# Patient Record
Sex: Male | Born: 1995 | Race: Black or African American | Hispanic: No | Marital: Married | Smoking: Current every day smoker
Health system: Southern US, Community
[De-identification: ages and names within clinical notes are randomized; demographics above are authoritative.]

## PROBLEM LIST (undated history)

## (undated) DIAGNOSIS — N4400 Torsion of testis, unspecified: Secondary | ICD-10-CM

## (undated) HISTORY — PX: TESTICLE SURGERY: SHX794

---

## 2018-12-17 ENCOUNTER — Emergency Department (HOSPITAL_COMMUNITY): Payer: Worker's Compensation

## 2018-12-17 ENCOUNTER — Encounter (HOSPITAL_COMMUNITY): Payer: Self-pay | Admitting: Obstetrics and Gynecology

## 2018-12-17 ENCOUNTER — Other Ambulatory Visit: Payer: Self-pay

## 2018-12-17 ENCOUNTER — Emergency Department (HOSPITAL_COMMUNITY)
Admission: EM | Admit: 2018-12-17 | Discharge: 2018-12-18 | Disposition: A | Discharge status: Home / Self Care | Payer: Worker's Compensation | Attending: Emergency Medicine | Admitting: Emergency Medicine

## 2018-12-17 DIAGNOSIS — Y999 Unspecified external cause status: Secondary | ICD-10-CM | POA: Insufficient documentation

## 2018-12-17 DIAGNOSIS — W010XXA Fall on same level from slipping, tripping and stumbling without subsequent striking against object, initial encounter: Secondary | ICD-10-CM | POA: Diagnosis not present

## 2018-12-17 DIAGNOSIS — S82891A Other fracture of right lower leg, initial encounter for closed fracture: Secondary | ICD-10-CM

## 2018-12-17 DIAGNOSIS — Y939 Activity, unspecified: Secondary | ICD-10-CM | POA: Insufficient documentation

## 2018-12-17 DIAGNOSIS — S82841A Displaced bimalleolar fracture of right lower leg, initial encounter for closed fracture: Secondary | ICD-10-CM | POA: Insufficient documentation

## 2018-12-17 DIAGNOSIS — F17210 Nicotine dependence, cigarettes, uncomplicated: Secondary | ICD-10-CM | POA: Diagnosis not present

## 2018-12-17 DIAGNOSIS — Y929 Unspecified place or not applicable: Secondary | ICD-10-CM | POA: Insufficient documentation

## 2018-12-17 DIAGNOSIS — S99911A Unspecified injury of right ankle, initial encounter: Secondary | ICD-10-CM | POA: Diagnosis present

## 2018-12-17 HISTORY — DX: Torsion of testis, unspecified: N44.00

## 2018-12-17 MED ORDER — OXYCODONE-ACETAMINOPHEN 5-325 MG PO TABS: 1.0000 | ORAL_TABLET | ORAL | 0 refills | Status: DC | PRN

## 2018-12-17 MED ORDER — PROPOFOL 10 MG/ML IV BOLUS
200.0000 mg | Freq: Once | INTRAVENOUS | Status: AC
  Administered 2018-12-17: 40 mg via INTRAVENOUS
  Filled 2018-12-17: qty 20

## 2018-12-17 MED ORDER — HYDROMORPHONE HCL 1 MG/ML IJ SOLN
1.0000 mg | Freq: Once | INTRAMUSCULAR | Status: AC
  Administered 2018-12-17: 21:00:00 1 mg via INTRAMUSCULAR
  Filled 2018-12-17: qty 1

## 2018-12-17 MED ORDER — IBUPROFEN 200 MG PO TABS
600.0000 mg | ORAL_TABLET | Freq: Once | ORAL | Status: AC
  Administered 2018-12-17: 600 mg via ORAL
  Filled 2018-12-17: qty 3

## 2018-12-17 NOTE — ED Provider Notes (Signed)
Patient signed out to me at shift change by Dr. Wilson Singer.    Post-reduction films pending.  Dr. Lyla Glassing to be notified after films are completed.  10:59 PM Repeat films reviewed by Dr. Lyla Glassing, who recommends CT in the ED.  After discussing the case with Dr. Lyla Glassing, the patient was no longer visible on the tract board.  I discovered that he had been discharged by the RN.  Fortunately, I caught the patient in the waiting room.  He was readmitted to the emergency department, completed the necessary CT, and was discharged.   Montine Circle, PA-C 12/18/18 0013    Virgel Manifold, MD 12/18/18 1204

## 2018-12-17 NOTE — ED Provider Notes (Signed)
[facility]-EMERGENCY DEPT Provider Note   CSN: [id number] Arrival date & time: 12/17/18  1802     History   Chief Complaint Chief Complaint  Patient presents with  . Ankle Pain    HPI Mathew Acevedo Surya Schroeter is a 23 y.o. male.     HPI   23yM with R ankle pain. Slipped on wet surface. Severe and persistent pain/swelling since then. Cannot bear weight. Denies any other injury. Happened earlier this afternoon. No intervention prior to arrival.   Past Medical History:  Diagnosis Date  . Testicular torsion     There are no active problems to display for this patient.  Past Surgical History:  Procedure Laterality Date  . TESTICLE SURGERY     Torsion     Home Medications    Prior to Admission medications   Not on File   Family History No family history on file.  Social History Social History   Tobacco Use  . Smoking status: Current Every Day Smoker    Types: Cigarettes  . Smokeless tobacco: Never Used  Substance Use Topics  . Alcohol use: Yes    Comment: Socially  . Drug use: Not Currently    Allergies   Patient has no allergy information on record.   Review of Systems Review of Systems  All systems reviewed and negative, other than as noted in HPI.  Physical Exam Updated Vital Signs BP 130/82 (BP Location: Right Arm)   Pulse (!) 116   Temp 98.4 F (36.9 C) (Oral)   Resp 16   SpO2 96%   Physical Exam Vitals signs and nursing note reviewed.  Constitutional:      General: He is not in acute distress.    Appearance: He is well-developed.  HENT:     Head: Normocephalic and atraumatic.  Eyes:     General:        Right eye: No discharge.        Left eye: No discharge.     Conjunctiva/sclera: Conjunctivae normal.  Neck:     Musculoskeletal: Neck supple.  Cardiovascular:     Rate and Rhythm: Normal rate and regular rhythm.     Heart sounds: Normal heart sounds. No murmur. No friction rub. No gallop.    Pulmonary:     Effort: Pulmonary effort is normal. No respiratory distress.     Breath sounds: Normal breath sounds.  Abdominal:     General: There is no distension.     Palpations: Abdomen is soft.     Tenderness: There is no abdominal tenderness.  Musculoskeletal:        General: Swelling, tenderness, deformity and signs of injury present.     Comments: Marked swelling, deformity R ankle. TTP. Able to wiggle toes. Good DP pulse. Sensation intact to light touch. No proximal tib/fib tenderness.   Skin:    General: Skin is warm and dry.  Neurological:     Mental Status: He is alert.  Psychiatric:        Behavior: Behavior normal.        Thought Content: Thought content normal.      ED Treatments / Results  Labs (all labs ordered are listed, but only abnormal results are displayed) Labs Reviewed - No data to display  EKG None  Radiology Dg Ankle Complete Right  Result Date: 12/17/2018 CLINICAL DATA:  Post reduction EXAM: RIGHT ANKLE - COMPLETE 3+ VIEW COMPARISON:  Radiograph 12/17/2018 FINDINGS: Patient is post reduction and splinting of  the ankle. There is mild residual posterior displacement of the distal fibular diaphyseal fracture (Weber C) as well as a small minimally displaced posterior malleolar fracture fragment. Mild lateral talar shift persists though this is suboptimally evaluated in the absence of a mortise view. IMPRESSION: 1. Grossly improved alignment post reduction and splinting. 2. Mild residual posterior displacement of the distal fibular diaphyseal fracture and small minimally displaced posterior malleolar fracture. 3. Mild lateral talar shift persists though this is suboptimally evaluated in the absence of a mortise view. Electronically Signed   By: Kreg ShropshirePrice  DeHay M.D.   On: 12/17/2018 22:40   Dg Ankle Complete Right  Result Date: 12/17/2018 CLINICAL DATA:  Pain EXAM: RIGHT ANKLE - COMPLETE 3+ VIEW COMPARISON:  None. FINDINGS: There is an acute displaced  fracture of the distal fibula. There is medial displacement of the tibia with respect to the talus there is extensive surrounding soft tissue swelling. There is a slight irregularity of the posterior malleolus without a clear fracture plane. A large joint effusion is noted. IMPRESSION: 1. Acute displaced fracture of the distal fibula with disruption of the mortise joint. 2. Irregular appearance of the posterior malleolus without a clear fracture plane. 3. Extensive surrounding soft tissue swelling. Electronically Signed   By: Katherine Mantlehristopher  Green M.D.   On: 12/17/2018 19:13   Ct Ankle Right Wo Contrast  Result Date: 12/18/2018 CLINICAL DATA:  Fracture EXAM: CT OF THE RIGHT ANKLE WITHOUT CONTRAST TECHNIQUE: Multidetector CT imaging of the right ankle was performed according to the standard protocol. Multiplanar CT image reconstructions were also generated. COMPARISON:  12/17/2018 FINDINGS: Bones/Joint/Cartilage Again noted is a displaced fracture of the distal fibular diaphysis. There is widening of the medial clear space. There is an acute fracture of the posterior malleolus. Ligaments Suboptimally assessed by CT. Muscles and Tendons Not well evaluated on this exam. Soft tissues There is extensive surrounding soft tissue swelling. IMPRESSION: 1. Displaced fracture of the distal fibular diaphysis. 2. Acute fracture of the posterior malleolus. 3. Persistent lateral talar shift. 4. Extensive surrounding soft tissue swelling. Electronically Signed   By: Katherine Mantlehristopher  Green M.D.   On: 12/18/2018 00:19    Procedures .Sedation  Date/Time: 12/17/2018 9:50 PM Performed by: Raeford RazorKohut, Caron Ode, MD Authorized by: Raeford RazorKohut, Keyetta Hollingworth, MD   Consent:    Consent obtained:  Verbal and written   Consent given by:  Patient   Risks discussed:  Allergic reaction, dysrhythmia, inadequate sedation, nausea, prolonged hypoxia resulting in organ damage, prolonged sedation necessitating reversal, respiratory compromise necessitating  ventilatory assistance and intubation and vomiting   Alternatives discussed:  Analgesia without sedation, anxiolysis and regional anesthesia Universal protocol:    Procedure explained and questions answered to patient or proxy's satisfaction: yes     Relevant documents present and verified: yes     Test results available and properly labeled: yes     Imaging studies available: yes     Required blood products, implants, devices, and special equipment available: yes     Site/side marked: yes     Immediately prior to procedure a time out was called: yes     Patient identity confirmation method:  Verbally with patient Indications:    Procedure necessitating sedation performed by:  Physician performing sedation Pre-sedation assessment:    Time since last food or drink:  .   ASA classification: class 1 - normal, healthy patient     Neck mobility: normal     Mouth opening:  3 or more finger widths   Thyromental distance:  4 finger widths   Mallampati score:  II - soft palate, uvula, fauces visible   Pre-sedation assessments completed and reviewed: airway patency, cardiovascular function, hydration status, mental status, nausea/vomiting, pain level, respiratory function and temperature   Immediate pre-procedure details:    Reassessment: Patient reassessed immediately prior to procedure     Reviewed: vital signs, relevant labs/tests and NPO status     Verified: bag valve mask available, emergency equipment available, intubation equipment available, IV patency confirmed, oxygen available and suction available   Procedure details (see MAR for exact dosages):    Preoxygenation:  Nasal cannula   Sedation:  Propofol   Intended level of sedation: deep   Intra-procedure monitoring:  Blood pressure monitoring, cardiac monitor, continuous pulse oximetry, frequent LOC assessments, frequent vital sign checks and continuous capnometry   Intra-procedure events: none     Total Provider sedation time  (minutes):  30 Post-procedure details:    Attendance: Constant attendance by certified staff until patient recovered     Recovery: Patient returned to pre-procedure baseline     Post-sedation assessments completed and reviewed: airway patency, cardiovascular function, hydration status, mental status, nausea/vomiting, pain level, respiratory function and temperature     Patient is stable for discharge or admission: yes     Patient tolerance:  Tolerated well, no immediate complications Reduction of dislocation, R ankle  Date/Time: 12/17/2018 9:50 PM Performed by: Raeford Razor, MD Authorized by: Raeford Razor, MD  Consent: Verbal consent obtained. Written consent obtained. Risks and benefits: risks, benefits and alternatives were discussed Consent given by: patient Patient understanding: patient states understanding of the procedure being performed Patient consent: the patient's understanding of the procedure matches consent given Procedure consent: procedure consent matches procedure scheduled Relevant documents: relevant documents present and verified Test results: test results available and properly labeled Site marked: the operative site was marked Imaging studies: imaging studies available Patient identity confirmed: verbally with patient and arm band Time out: Immediately prior to procedure a "time out" was called to verify the correct patient, procedure, equipment, support staff and site/side marked as required. Local anesthesia used: no  Anesthesia: Local anesthesia used: no  Sedation: Patient sedated: yes Sedation type: moderate (conscious) sedation Sedatives: propofol Analgesia: hydromorphone Vitals: Vital signs were monitored during sedation.  Patient tolerance: patient tolerated the procedure well with no immediate complications     (including critical care time)  Medications Ordered in ED Medications  HYDROmorphone (DILAUDID) injection 1 mg (1 mg Intramuscular  Given 12/17/18 2040)  ibuprofen (ADVIL) tablet 600 mg (600 mg Oral Given 12/17/18 2040)  propofol (DIPRIVAN) 10 mg/mL bolus/IV push 200 mg (40 mg Intravenous Given 12/17/18 2157)  oxyCODONE-acetaminophen (PERCOCET/ROXICET) 5-325 MG per tablet 2 tablet (2 tablets Oral Given 12/18/18 0019)     Initial Impression / Assessment and Plan / ED Course  I have reviewed the triage vital signs and the nursing notes.  Pertinent labs & imaging results that were available during my care of the patient were reviewed by me and considered in my medical decision making (see chart for details).       23 year old male with closed fracture dislocation of his right ankle.  Neurovascular intact.  Reduced and splinted.  Remains neurovascular intact post procedure.  Care signed out to Gillis Ends, [place].  Repeat discussion with orthopedics after reduction films done.  Anticipate outpatient follow-up with orthopedics.  Crutches.  PRN pain medication.   Final Clinical Impressions(s) / ED Diagnoses   Final diagnoses:  Closed fracture of right ankle,  initial encounter    ED Discharge Orders    None       Virgel Manifold, MD 12/18/18 1214

## 2018-12-17 NOTE — ED Triage Notes (Signed)
Patient reports the ED with complaint of ankle pain to the right ankle. Patient reports he slipped and fell in the rain. Swelling noted to area.

## 2018-12-18 ENCOUNTER — Emergency Department (HOSPITAL_COMMUNITY): Payer: Worker's Compensation

## 2018-12-18 MED ORDER — OXYCODONE-ACETAMINOPHEN 5-325 MG PO TABS
2.0000 | ORAL_TABLET | Freq: Once | ORAL | Status: AC
  Administered 2018-12-18: 2 via ORAL
  Filled 2018-12-18: qty 2

## 2018-12-21 ENCOUNTER — Encounter (HOSPITAL_BASED_OUTPATIENT_CLINIC_OR_DEPARTMENT_OTHER): Payer: Self-pay | Admitting: *Deleted

## 2018-12-21 ENCOUNTER — Other Ambulatory Visit (HOSPITAL_COMMUNITY): Payer: Self-pay | Admitting: Orthopedic Surgery

## 2018-12-21 ENCOUNTER — Other Ambulatory Visit: Payer: Self-pay

## 2018-12-22 ENCOUNTER — Ambulatory Visit (HOSPITAL_BASED_OUTPATIENT_CLINIC_OR_DEPARTMENT_OTHER)
Admission: RE | Admit: 2018-12-22 | Discharge: 2018-12-22 | Disposition: A | Discharge status: Home / Self Care | Source: Ambulatory Visit | Attending: Orthopedic Surgery | Admitting: Orthopedic Surgery

## 2018-12-22 ENCOUNTER — Ambulatory Visit (HOSPITAL_BASED_OUTPATIENT_CLINIC_OR_DEPARTMENT_OTHER): Admitting: Anesthesiology

## 2018-12-22 ENCOUNTER — Other Ambulatory Visit (HOSPITAL_COMMUNITY)
Admission: RE | Admit: 2018-12-22 | Discharge: 2018-12-22 | Disposition: A | Discharge status: Home / Self Care | Source: Ambulatory Visit | Attending: Orthopedic Surgery | Admitting: Orthopedic Surgery

## 2018-12-22 ENCOUNTER — Encounter (HOSPITAL_BASED_OUTPATIENT_CLINIC_OR_DEPARTMENT_OTHER)
Admission: RE | Disposition: A | Discharge status: Home / Self Care | Payer: Self-pay | Source: Ambulatory Visit | Attending: Orthopedic Surgery

## 2018-12-22 ENCOUNTER — Encounter (HOSPITAL_BASED_OUTPATIENT_CLINIC_OR_DEPARTMENT_OTHER): Payer: Self-pay | Admitting: Certified Registered"

## 2018-12-22 DIAGNOSIS — F17210 Nicotine dependence, cigarettes, uncomplicated: Secondary | ICD-10-CM | POA: Diagnosis not present

## 2018-12-22 DIAGNOSIS — W010XXA Fall on same level from slipping, tripping and stumbling without subsequent striking against object, initial encounter: Secondary | ICD-10-CM | POA: Insufficient documentation

## 2018-12-22 DIAGNOSIS — S93421A Sprain of deltoid ligament of right ankle, initial encounter: Secondary | ICD-10-CM | POA: Diagnosis not present

## 2018-12-22 DIAGNOSIS — Z20828 Contact with and (suspected) exposure to other viral communicable diseases: Secondary | ICD-10-CM | POA: Insufficient documentation

## 2018-12-22 DIAGNOSIS — S93431A Sprain of tibiofibular ligament of right ankle, initial encounter: Secondary | ICD-10-CM | POA: Insufficient documentation

## 2018-12-22 DIAGNOSIS — S82851A Displaced trimalleolar fracture of right lower leg, initial encounter for closed fracture: Secondary | ICD-10-CM | POA: Diagnosis not present

## 2018-12-22 HISTORY — PX: ORIF ANKLE FRACTURE: SHX5408

## 2018-12-22 LAB — SARS CORONAVIRUS 2 BY RT PCR (HOSPITAL ORDER, PERFORMED IN ~~LOC~~ HOSPITAL LAB): SARS Coronavirus 2: NEGATIVE

## 2018-12-22 SURGERY — OPEN REDUCTION INTERNAL FIXATION (ORIF) ANKLE FRACTURE
Anesthesia: General | Site: Ankle | Laterality: Right

## 2018-12-22 MED ORDER — DEXAMETHASONE SODIUM PHOSPHATE 10 MG/ML IJ SOLN
INTRAMUSCULAR | Status: DC | PRN
  Administered 2018-12-22: 5 mg via INTRAVENOUS

## 2018-12-22 MED ORDER — PROPOFOL 500 MG/50ML IV EMUL
INTRAVENOUS | Status: DC | PRN
  Administered 2018-12-22: 25 ug/kg/min via INTRAVENOUS

## 2018-12-22 MED ORDER — BUPIVACAINE LIPOSOME 1.3 % IJ SUSP
INTRAMUSCULAR | Status: DC | PRN
  Administered 2018-12-22: 10 mL via PERINEURAL

## 2018-12-22 MED ORDER — FENTANYL CITRATE (PF) 100 MCG/2ML IJ SOLN
INTRAMUSCULAR | Status: AC
  Filled 2018-12-22: qty 2

## 2018-12-22 MED ORDER — FENTANYL CITRATE (PF) 100 MCG/2ML IJ SOLN
50.0000 ug | INTRAMUSCULAR | Status: DC | PRN
  Administered 2018-12-22: 100 ug via INTRAVENOUS

## 2018-12-22 MED ORDER — KETOROLAC TROMETHAMINE 30 MG/ML IJ SOLN
INTRAMUSCULAR | Status: DC | PRN
  Administered 2018-12-22: 30 mg via INTRAVENOUS

## 2018-12-22 MED ORDER — PROMETHAZINE HCL 25 MG/ML IJ SOLN: 6.2500 mg | INTRAMUSCULAR | Status: DC | PRN

## 2018-12-22 MED ORDER — BUPIVACAINE HCL (PF) 0.25 % IJ SOLN
INTRAMUSCULAR | Status: DC | PRN
  Administered 2018-12-22: 15 mL

## 2018-12-22 MED ORDER — CEFAZOLIN SODIUM-DEXTROSE 2-4 GM/100ML-% IV SOLN
INTRAVENOUS | Status: AC
  Filled 2018-12-22: qty 100

## 2018-12-22 MED ORDER — LACTATED RINGERS IV SOLN
INTRAVENOUS | Status: DC
  Administered 2018-12-22: 12:00:00 via INTRAVENOUS

## 2018-12-22 MED ORDER — OXYCODONE HCL 5 MG PO TABS: 5.0000 mg | ORAL_TABLET | Freq: Four times a day (QID) | ORAL | 0 refills | Status: AC | PRN

## 2018-12-22 MED ORDER — ENOXAPARIN SODIUM 40 MG/0.4ML ~~LOC~~ SOLN: 40.0000 mg | SUBCUTANEOUS | 0 refills | Status: AC

## 2018-12-22 MED ORDER — FENTANYL CITRATE (PF) 100 MCG/2ML IJ SOLN
25.0000 ug | INTRAMUSCULAR | Status: DC | PRN
  Administered 2018-12-22: 25 ug via INTRAVENOUS

## 2018-12-22 MED ORDER — LIDOCAINE HCL (CARDIAC) PF 100 MG/5ML IV SOSY
PREFILLED_SYRINGE | INTRAVENOUS | Status: DC | PRN
  Administered 2018-12-22: 50 mg via INTRAVENOUS

## 2018-12-22 MED ORDER — MIDAZOLAM HCL 2 MG/2ML IJ SOLN
1.0000 mg | INTRAMUSCULAR | Status: DC | PRN
  Administered 2018-12-22: 12:00:00 2 mg via INTRAVENOUS

## 2018-12-22 MED ORDER — SODIUM CHLORIDE 0.9 % IV SOLN: INTRAVENOUS | Status: DC

## 2018-12-22 MED ORDER — CHLORHEXIDINE GLUCONATE 4 % EX LIQD: 60.0000 mL | Freq: Once | CUTANEOUS | Status: DC

## 2018-12-22 MED ORDER — MIDAZOLAM HCL 2 MG/2ML IJ SOLN
INTRAMUSCULAR | Status: AC
  Filled 2018-12-22: qty 2

## 2018-12-22 MED ORDER — CEFAZOLIN SODIUM-DEXTROSE 2-4 GM/100ML-% IV SOLN
2.0000 g | INTRAVENOUS | Status: AC
  Administered 2018-12-22: 2 g via INTRAVENOUS

## 2018-12-22 MED ORDER — PROPOFOL 10 MG/ML IV BOLUS
INTRAVENOUS | Status: DC | PRN
  Administered 2018-12-22: 300 mg via INTRAVENOUS

## 2018-12-22 MED ORDER — BUPIVACAINE-EPINEPHRINE 0.25% -1:200000 IJ SOLN
INTRAMUSCULAR | Status: DC | PRN
  Administered 2018-12-22: 10 mL

## 2018-12-22 SURGICAL SUPPLY — 75 items
ANCHOR SUT 1.45 SZ 1 SHORT (Anchor) ×3 IMPLANT
BANDAGE ESMARK 6X9 LF (GAUZE/BANDAGES/DRESSINGS) ×1 IMPLANT
BIT DRILL 2.5X2.75 QC CALB (BIT) ×3 IMPLANT
BIT DRILL 3.5X5.5 QC CALB (BIT) ×3 IMPLANT
BLADE SURG 15 STRL LF DISP TIS (BLADE) ×2 IMPLANT
BLADE SURG 15 STRL SS (BLADE) ×4
BNDG COHESIVE 4X5 TAN STRL (GAUZE/BANDAGES/DRESSINGS) ×3 IMPLANT
BNDG COHESIVE 6X5 TAN STRL LF (GAUZE/BANDAGES/DRESSINGS) ×3 IMPLANT
BNDG ESMARK 4X9 LF (GAUZE/BANDAGES/DRESSINGS) IMPLANT
BNDG ESMARK 6X9 LF (GAUZE/BANDAGES/DRESSINGS) ×3
CANISTER SUCT 1200ML W/VALVE (MISCELLANEOUS) ×3 IMPLANT
CHLORAPREP W/TINT 26 (MISCELLANEOUS) ×3 IMPLANT
COVER BACK TABLE REUSABLE LG (DRAPES) ×3 IMPLANT
COVER WAND RF STERILE (DRAPES) IMPLANT
CUFF TOURN SGL QUICK 34 (TOURNIQUET CUFF) ×2
CUFF TRNQT CYL 34X4.125X (TOURNIQUET CUFF) ×1 IMPLANT
DECANTER SPIKE VIAL GLASS SM (MISCELLANEOUS) IMPLANT
DRAPE EXTREMITY T 121X128X90 (DISPOSABLE) ×3 IMPLANT
DRAPE HALF SHEET 70X43 (DRAPES) ×3 IMPLANT
DRAPE OEC MINIVIEW 54X84 (DRAPES) ×3 IMPLANT
DRAPE U-SHAPE 47X51 STRL (DRAPES) ×3 IMPLANT
DRSG MEPITEL 4X7.2 (GAUZE/BANDAGES/DRESSINGS) ×3 IMPLANT
DRSG PAD ABDOMINAL 8X10 ST (GAUZE/BANDAGES/DRESSINGS) ×6 IMPLANT
ELECT REM PT RETURN 9FT ADLT (ELECTROSURGICAL) ×3
ELECTRODE REM PT RTRN 9FT ADLT (ELECTROSURGICAL) ×1 IMPLANT
FIXATION ZIPTIGHT ANKLE SNDSMS (Ankle) ×1 IMPLANT
GAUZE SPONGE 4X4 12PLY STRL (GAUZE/BANDAGES/DRESSINGS) ×3 IMPLANT
GLOVE BIO SURGEON STRL SZ7 (GLOVE) ×3 IMPLANT
GLOVE BIO SURGEON STRL SZ8 (GLOVE) ×3 IMPLANT
GLOVE BIOGEL PI IND STRL 7.0 (GLOVE) ×1 IMPLANT
GLOVE BIOGEL PI IND STRL 8 (GLOVE) ×3 IMPLANT
GLOVE BIOGEL PI INDICATOR 7.0 (GLOVE) ×2
GLOVE BIOGEL PI INDICATOR 8 (GLOVE) ×6
GLOVE ECLIPSE 8.0 STRL XLNG CF (GLOVE) ×3 IMPLANT
GOWN STRL REUS W/ TWL LRG LVL3 (GOWN DISPOSABLE) ×1 IMPLANT
GOWN STRL REUS W/ TWL XL LVL3 (GOWN DISPOSABLE) ×2 IMPLANT
GOWN STRL REUS W/TWL LRG LVL3 (GOWN DISPOSABLE) ×2
GOWN STRL REUS W/TWL XL LVL3 (GOWN DISPOSABLE) ×4
NEEDLE HYPO 22GX1.5 SAFETY (NEEDLE) IMPLANT
NS IRRIG 1000ML POUR BTL (IV SOLUTION) ×3 IMPLANT
PACK BASIN DAY SURGERY FS (CUSTOM PROCEDURE TRAY) ×3 IMPLANT
PAD CAST 4YDX4 CTTN HI CHSV (CAST SUPPLIES) ×1 IMPLANT
PADDING CAST ABS 4INX4YD NS (CAST SUPPLIES)
PADDING CAST ABS COTTON 4X4 ST (CAST SUPPLIES) IMPLANT
PADDING CAST COTTON 4X4 STRL (CAST SUPPLIES) ×2
PADDING CAST COTTON 6X4 STRL (CAST SUPPLIES) ×3 IMPLANT
PENCIL BUTTON HOLSTER BLD 10FT (ELECTRODE) ×3 IMPLANT
PLATE ACE 100DEG 8HOLE (Plate) ×3 IMPLANT
SANITIZER HAND PURELL 535ML FO (MISCELLANEOUS) ×3 IMPLANT
SCREW CORTICAL 3.5MM  16MM (Screw) ×2 IMPLANT
SCREW CORTICAL 3.5MM 14MM (Screw) ×6 IMPLANT
SCREW CORTICAL 3.5MM 16MM (Screw) ×1 IMPLANT
SCREW CORTICAL 3.5MM 18MM (Screw) ×12 IMPLANT
SLEEVE SCD COMPRESS KNEE MED (MISCELLANEOUS) ×3 IMPLANT
SPLINT FAST PLASTER 5X30 (CAST SUPPLIES) ×40
SPLINT PLASTER CAST FAST 5X30 (CAST SUPPLIES) ×20 IMPLANT
SPONGE LAP 18X18 RF (DISPOSABLE) ×3 IMPLANT
STOCKINETTE 6  STRL (DRAPES) ×2
STOCKINETTE 6 STRL (DRAPES) ×1 IMPLANT
SUCTION FRAZIER HANDLE 10FR (MISCELLANEOUS) ×2
SUCTION TUBE FRAZIER 10FR DISP (MISCELLANEOUS) ×1 IMPLANT
SUT ETHILON 3 0 PS 1 (SUTURE) ×6 IMPLANT
SUT FIBERWIRE #2 38 T-5 BLUE (SUTURE)
SUT MNCRL AB 3-0 PS2 18 (SUTURE) IMPLANT
SUT VIC AB 0 SH 27 (SUTURE) ×3 IMPLANT
SUT VIC AB 2-0 SH 27 (SUTURE) ×6
SUT VIC AB 2-0 SH 27XBRD (SUTURE) ×3 IMPLANT
SUTURE FIBERWR #2 38 T-5 BLUE (SUTURE) IMPLANT
SYR BULB 3OZ (MISCELLANEOUS) ×3 IMPLANT
SYR CONTROL 10ML LL (SYRINGE) IMPLANT
TOWEL GREEN STERILE FF (TOWEL DISPOSABLE) ×6 IMPLANT
TUBE CONNECTING 20'X1/4 (TUBING) ×1
TUBE CONNECTING 20X1/4 (TUBING) ×2 IMPLANT
UNDERPAD 30X36 HEAVY ABSORB (UNDERPADS AND DIAPERS) ×3 IMPLANT
ZIPTIGHT ANKLE SYNODESMOSS FIX (Ankle) ×3 IMPLANT

## 2018-12-22 NOTE — Op Note (Signed)
12/22/2018  2:23 PM  PATIENT:  Mathew Acevedo  23 y.o. male  PRE-OPERATIVE DIAGNOSIS: 1.  Right ankle trimalleolar fracture      2.  Right ankle syndesmosis disruption      3.  Right ankle deltoid ligament rupture  POST-OPERATIVE DIAGNOSIS:  Same  Procedure(s): 1.  Open treatment of right ankle trimalleolar fracture with internal fixation without fixation of the posterior lip 2.  Open treatment of right ankle syndesmosis disruption with internal fixation 3.  Stress examination of the right ankle under fluoroscopy 4.  Open repair of right ankle deltoid ligament rupture 5.  AP, mortise and lateral radiographs of the right ankle  SURGEON:  Wylene Simmer, MD  ASSISTANT: None  ANESTHESIA:   General, regional  EBL:  minimal   TOURNIQUET:   Total Tourniquet Time Documented: Thigh (Right) - 67 minutes Total: Thigh (Right) - 67 minutes  COMPLICATIONS:  None apparent  DISPOSITION:  Extubated, awake and stable to recovery.  INDICATION FOR PROCEDURE: The patient is a 23 year old male who injured his right ankle approximately 5 days ago.  He sustained a trimalleolar fracture with disruption of the syndesmosis and deltoid ligament.  He presents now for operative treatment of this displaced and unstable right ankle injury.  The risks and benefits of the alternative treatment options have been discussed in detail.  The patient wishes to proceed with surgery and specifically understands risks of bleeding, infection, nerve damage, blood clots, need for additional surgery, amputation and death.  PROCEDURE IN DETAIL:  After pre operative consent was obtained, and the correct operative site was identified, the patient was brought to the operating room and placed supine on the OR table.  Anesthesia was administered.  Pre-operative antibiotics were administered.  A surgical timeout was taken.  The right lower extremity was prepped and draped in standard sterile fashion with a tourniquet  around the thigh.  The extremity was exsanguinated and the tourniquet was inflated to 250 mmHg.  A longitudinal incision was made over the lateral malleolus.  Dissection was carried down through the subcutaneous tissues.  The fracture site was identified.  It was cleaned of all hematoma and irrigated.  It was reduced and held with a tenaculum.  A 3.5 mm lag screw was inserted from posterior to anterior across the fracture site and compressed the fracture appropriately.  An 8 hole one third tubular plate from the [employer] small frag set was then contoured to fit the lateral malleolus.  It was secured proximally with 3 bicortical screws and distally with a combination of bicortical and unicortical screws.  Radiographs confirmed appropriate reduction of the fibula.  A stress examination was then performed.  Dorsiflexion and external rotation stress was applied to the supinated forefoot.  There was substantial widening of the medial clear space and syndesmosis.  The decision was made at that point to proceed with syndesmosis fixation.  Dissection was carried distally and anteriorly to expose the anterior aspect of the incision area.  The syndesmosis was reduced and held while a drill hole was made through the distal fibula and tibia.  This was in the third most distal hole of the plate in line with the physeal scar of the distal tibia.  A [employer] zip tight device was inserted through the hole and toggled at the far cortex of the tibia.  The syndesmosis was compressed and the device tightened appropriately.  Radiographs confirmed appropriate alignment of the syndesmosis and ankle mortise.  The stress examination was repeated  showing stability at the syndesmosis but continued instability at the medial clear space with talar tilt consistent with deltoid ligament rupture.  A longitudinal incision was made over the medial malleolus.  Dissection was carried down to the level of the superficial deltoid ligament.  Both  the deep and superficial deltoid ligaments were completely ruptured with gross instability medially.  The joint was reduced.  The medial malleolus was cleared of all torn tendon and decorticated with a rondure.  A 1.45 mm juggernaut was inserted.  Suture was passed down through the deep deltoid fibers distally.  The deltoid fibers were advanced back onto the medial malleolus and the suture tied.  The suture was then passed through the superficial deltoid proximally and tied and then again through the superficial deltoid distally and tied for a final time.  The remaining tear and superficial deltoid was repaired with figure-of-eight sutures of 0 Vicryl.  Final AP, mortise and lateral radiographs confirmed appropriate position and length of all hardware and appropriate reduction of the ankle fracture.  The medial malleolus avulsion fracture was excised during debridement of the deltoid ligament origin.  The posterior malleolus fracture was noted to be adequately reduced and did not include any significant amount of articular surface.  The wounds were irrigated copiously.  Deep subcutaneous tissues were approximated with 2-0 Vicryl.  Skin incisions were closed with nylon.  Sterile dressings were applied followed by a well-padded short leg splint.  Tourniquet was released after application of the dressings.  The patient was awakened from anesthesia and transported to the recovery room in stable condition.   FOLLOW UP PLAN: Nonweightbearing on the right lower extremity.  Follow-up in 2 weeks for suture removal and conversion to a short leg cast.  Plan 6 weeks postop nonweightbearing immobilization.  Lovenox for DVT prophylaxis due to history of cigarette smoking.  RADIOGRAPHS: AP, mortise and lateral radiographs of the right ankle are obtained intraoperatively.  These show interval reduction and fixation of the trimalleolar ankle fracture and syndesmosis.  Hardware is appropriately positioned and of the  appropriate lengths.  No other acute injuries are noted.

## 2018-12-22 NOTE — Progress Notes (Signed)
Assisted Dr. Singer with right, ultrasound guided, popliteal/saphenous block. Side rails up, monitors on throughout procedure. See vital signs in flow sheet. Tolerated Procedure well. 

## 2018-12-22 NOTE — Discharge Instructions (Addendum)
Mathew Simmer, MD [facility]  Please read the following information regarding your care after surgery.  Medications  You only need a prescription for the narcotic pain medicine (ex. oxycodone, Percocet, Norco).  All of the other medicines listed below are available over the counter. X Aleve 2 pills twice a day for the first 3 days after surgery. X acetominophen (Tylenol) 650 mg every 4-6 hours as you need for minor to moderate pain X oxycodone as prescribed for severe pain  Narcotic pain medicine (ex. oxycodone, Percocet, Vicodin) will cause constipation.  To prevent this problem, take the following medicines while you are taking any pain medicine. X docusate sodium (Colace) 100 mg twice a day X senna (Senokot) 2 tablets twice a day  X To help prevent blood clots, take a Lovenox shot once a day for two weeks starting the day after surgery.  Then switch to a baby aspirin (81 mg) twice a day for a month.  You should also get up every hour while you are awake to move around.    Weight Bearing X Do not bear any weight on the operated leg or foot.  Cast / Splint / Dressing X Keep your splint, cast or dressing clean and dry.  Dont put anything (coat hanger, pencil, etc) down inside of it.  If it gets damp, use a hair dryer on the cool setting to dry it.  If it gets soaked, call the office to schedule an appointment for a cast change.  After your dressing, cast or splint is removed; you may shower, but do not soak or scrub the wound.  Allow the water to run over it, and then gently pat it dry.  Swelling It is normal for you to have swelling where you had surgery.  To reduce swelling and pain, keep your toes above your nose for at least 3 days after surgery.  It may be necessary to keep your foot or leg elevated for several weeks.  If it hurts, it should be elevated.  Follow Up Call my office at [phone] when you are discharged from the hospital or surgery center to schedule an appointment to  be seen two weeks after surgery.  Call my office at [phone] if you develop a fever >101.5 F, nausea, vomiting, bleeding from the surgical site or severe pain.      No ibuprofen before 8:00pm  Post Anesthesia Home Care Instructions  Activity: Get plenty of rest for the remainder of the day. A responsible individual must stay with you for 24 hours following the procedure.  For the next 24 hours, DO NOT: -Drive a car -Paediatric nurse -Drink alcoholic beverages -Take any medication unless instructed by your physician -Make any legal decisions or sign important papers.  Meals: Start with liquid foods such as gelatin or soup. Progress to regular foods as tolerated. Avoid greasy, spicy, heavy foods. If nausea and/or vomiting occur, drink only clear liquids until the nausea and/or vomiting subsides. Call your physician if vomiting continues.  Special Instructions/Symptoms: Your throat may feel dry or sore from the anesthesia or the breathing tube placed in your throat during surgery. If this causes discomfort, gargle with warm salt water. The discomfort should disappear within 24 hours.  If you had a scopolamine patch placed behind your ear for the management of post- operative nausea and/or vomiting:  1. The medication in the patch is effective for 72 hours, after which it should be removed.  Wrap patch in a tissue and discard in the  trash. Wash hands thoroughly with soap and water. 2. You may remove the patch earlier than 72 hours if you experience unpleasant side effects which may include dry mouth, dizziness or visual disturbances. 3. Avoid touching the patch. Wash your hands with soap and water after contact with the patch.     Regional Anesthesia Blocks  1. Numbness or the inability to move the "blocked" extremity may last from 3-48 hours after placement. The length of time depends on the medication injected and your individual response to the medication. If the numbness is  not going away after 48 hours, call your surgeon.  2. The extremity that is blocked will need to be protected until the numbness is gone and the  Strength has returned. Because you cannot feel it, you will need to take extra care to avoid injury. Because it may be weak, you may have difficulty moving it or using it. You may not know what position it is in without looking at it while the block is in effect.  3. For blocks in the legs and feet, returning to weight bearing and walking needs to be done carefully. You will need to wait until the numbness is entirely gone and the strength has returned. You should be able to move your leg and foot normally before you try and bear weight or walk. You will need someone to be with you when you first try to ensure you do not fall and possibly risk injury.  4. Bruising and tenderness at the needle site are common side effects and will resolve in a few days.  5. Persistent numbness or new problems with movement should be communicated to the surgeon or the [facility] [phone] [facility] [phone]).    Information for Discharge Teaching: EXPAREL (bupivacaine liposome injectable suspension)   Your surgeon or anesthesiologist gave you EXPAREL(bupivacaine) to help control your pain after surgery.   EXPAREL is a local anesthetic that provides pain relief by numbing the tissue around the surgical site.  EXPAREL is designed to release pain medication over time and can control pain for up to 72 hours.  Depending on how you respond to EXPAREL, you may require less pain medication during your recovery.  Possible side effects:  Temporary loss of sensation or ability to move in the area where bupivacaine was injected.  Nausea, vomiting, constipation  Rarely, numbness and tingling in your mouth or lips, lightheadedness, or anxiety may occur.  Call your doctor right away if you think you may be experiencing any of these  sensations, or if you have other questions regarding possible side effects.  Follow all other discharge instructions given to you by your surgeon or nurse. Eat a healthy diet and drink plenty of water or other fluids.  If you return to the hospital for any reason within 96 hours following the administration of EXPAREL, it is important for health care providers to know that you have received this anesthetic. A teal colored band has been placed on your arm with the date, time and amount of EXPAREL you have received in order to alert and inform your health care providers. Please leave this armband in place for the full 96 hours following administration, and then you may remove the band.

## 2018-12-22 NOTE — Anesthesia Preprocedure Evaluation (Addendum)
Anesthesia Evaluation  Patient identified by MRN, date of birth, ID band Patient awake    Reviewed: Allergy & Precautions, NPO status , Patient's Chart, lab work & pertinent test results  History of Anesthesia Complications Negative for: history of anesthetic complications  Airway Mallampati: II  TM Distance: >3 FB Neck ROM: Full    Dental no notable dental hx. (+) Dental Advisory Given   Pulmonary Current Smoker and Patient abstained from smoking.,    Pulmonary exam normal        Cardiovascular negative cardio ROS Normal cardiovascular exam     Neuro/Psych negative neurological ROS     GI/Hepatic negative GI ROS, Neg liver ROS,   Endo/Other  negative endocrine ROS  Renal/GU negative Renal ROS     Musculoskeletal negative musculoskeletal ROS (+)   Abdominal   Peds  Hematology negative hematology ROS (+)   Anesthesia Other Findings Day of surgery medications reviewed with the patient.  Reproductive/Obstetrics                            Anesthesia Physical Anesthesia Plan  ASA: II  Anesthesia Plan: General   Post-op Pain Management:  Regional for Post-op pain   Induction: Intravenous  PONV Risk Score and Plan: 2 and Ondansetron and Dexamethasone  Airway Management Planned: LMA  Additional Equipment:   Intra-op Plan:   Post-operative Plan: Extubation in OR  Informed Consent: I have reviewed the patients History and Physical, chart, labs and discussed the procedure including the risks, benefits and alternatives for the proposed anesthesia with the patient or authorized representative who has indicated his/her understanding and acceptance.     Dental advisory given  Plan Discussed with: Anesthesiologist and CRNA  Anesthesia Plan Comments:        Anesthesia Quick Evaluation

## 2018-12-22 NOTE — H&P (Signed)
Mathew Acevedo is an 23 y.o. male.   Chief Complaint: Right ankle pain HPI: The patient is a 23 year old male with a past medical history significant for smoking.  He injured his right ankle a few days ago when he slipped and fell.  He has a trimalleolar ankle fracture with syndesmosis disruption.  He presents now for operative treatment of this displaced and unstable right ankle injury.  Past Medical History:  Diagnosis Date  . Testicular torsion     Past Surgical History:  Procedure Laterality Date  . TESTICLE SURGERY     Torsion    History reviewed. No pertinent family history. Social History:  reports that he has been smoking cigarettes. He has been smoking about 0.25 packs per day. He has never used smokeless tobacco. He reports current alcohol use. He reports previous drug use.  Allergies: No Known Allergies  Medications Prior to Admission  Medication Sig Dispense Refill  . oxyCODONE-acetaminophen (PERCOCET/ROXICET) 5-325 MG tablet Take 1 tablet by mouth every 4 (four) hours as needed for severe pain. 15 tablet 0    Results for orders placed or performed during the hospital encounter of 12/22/18 (from the past 48 hour(s))  SARS Coronavirus 2 by RT PCR (hospital order, performed in [facility] hospital lab) Nasopharyngeal Nasopharyngeal Swab     Status: None   Collection Time: 12/22/18  8:13 AM   Specimen: Nasopharyngeal Swab  Result Value Ref Range   SARS Coronavirus 2 NEGATIVE NEGATIVE    Comment: (NOTE) If result is NEGATIVE SARS-CoV-2 target nucleic acids are NOT DETECTED. The SARS-CoV-2 RNA is generally detectable in upper and lower  respiratory specimens during the acute phase of infection. The lowest  concentration of SARS-CoV-2 viral copies this assay can detect is 250  copies / mL. A negative result does not preclude SARS-CoV-2 infection  and should not be used as the sole basis for treatment or other  patient management decisions.  A negative  result may occur with  improper specimen collection / handling, submission of specimen other  than nasopharyngeal swab, presence of viral mutation(s) within the  areas targeted by this assay, and inadequate number of viral copies  (<250 copies / mL). A negative result must be combined with clinical  observations, patient history, and epidemiological information. If result is POSITIVE SARS-CoV-2 target nucleic acids are DETECTED. The SARS-CoV-2 RNA is generally detectable in upper and lower  respiratory specimens dur ing the acute phase of infection.  Positive  results are indicative of active infection with SARS-CoV-2.  Clinical  correlation with patient history and other diagnostic information is  necessary to determine patient infection status.  Positive results do  not rule out bacterial infection or co-infection with other viruses. If result is PRESUMPTIVE POSTIVE SARS-CoV-2 nucleic acids MAY BE PRESENT.   A presumptive positive result was obtained on the submitted specimen  and confirmed on repeat testing.  While 2019 novel coronavirus  (SARS-CoV-2) nucleic acids may be present in the submitted sample  additional confirmatory testing may be necessary for epidemiological  and / or clinical management purposes  to differentiate between  SARS-CoV-2 and other Sarbecovirus currently known to infect humans.  If clinically indicated additional testing with an alternate test  methodology [phone]) is advised. The SARS-CoV-2 RNA is generally  detectable in upper and lower respiratory sp ecimens during the acute  phase of infection. The expected result is Negative. Fact Sheet for Patients:  BoilerBrush.com.cy Fact Sheet for Healthcare Providers: https://pope.com/ This test is not  yet approved or cleared by the [place] and has been authorized for detection and/or diagnosis of SARS-CoV-2 by FDA under an Emergency Use Authorization  (EUA).  This EUA will remain in effect (meaning this test can be used) for the duration of the COVID-19 declaration under Section 564(b)(1) of the Act, 21 U.S.C. section 360bbb-3(b)(1), unless the authorization is terminated or revoked sooner. Performed at [facility], [place] [address], [place], [place] [address]    No results found.  ROS no recent fever, chills, nausea, vomiting or change in his appetite  Blood pressure (!) 133/91, temperature 98.5 F (36.9 C), resp. rate 18, height 5\' 9"  (1.753 m), weight 88 kg, SpO2 97 %. Physical Exam  Well-nourished well-developed young man in no apparent distress.  Alert and oriented x4.  Mood and affect are normal.  Extraocular motions are intact.  Respirations are unlabored.  Gait is nonweightbearing on right.  Right lower extremity splint is intact.  Toes have brisk capillary refill and normal sensibility to light touch.  Normal strength in the toes.  No lymphadenopathy proximal to the cast.  Assessment/Plan Right ankle trimalleolar fracture and syndesmosis disruption with likely deltoid ligament rupture -to the operating room today for open treatment with internal fixation of the ankle fracture and syndesmosis disruption and possible deltoid ligament repair.  The risks and benefits of the alternative treatment options have been discussed in detail.  The patient wishes to proceed with surgery and specifically understands risks of bleeding, infection, nerve damage, blood clots, need for additional surgery, amputation and death.   Wylene Simmer, MD Dec 26, 2018, 12:20 PM

## 2018-12-22 NOTE — Anesthesia Postprocedure Evaluation (Signed)
Anesthesia Post Note  Patient: Mathew Acevedo  Procedure(s) Performed: OPEN REDUCTION INTERNAL FIXATION (ORIF) Right ankle trimalleolar fracture and syndesmosis; possible deltoid ligament repair (Right Ankle)     Patient location during evaluation: PACU Anesthesia Type: General Level of consciousness: sedated Pain management: pain level controlled Vital Signs Assessment: post-procedure vital signs reviewed and stable Respiratory status: spontaneous breathing and respiratory function stable Cardiovascular status: stable Postop Assessment: no apparent nausea or vomiting Anesthetic complications: no    Last Vitals:  Vitals:   12/22/18 1445 12/22/18 1500  BP: (!) 147/70 132/88  Pulse: 79 84  Resp: 17 (!) 23  Temp:    SpO2: 99% 100%                  Joanna Hall DANIEL

## 2018-12-22 NOTE — Anesthesia Procedure Notes (Signed)
Procedure Name: LMA Insertion Date/Time: 12/22/2018 12:52 PM Performed by: Signe Colt, CRNA Pre-anesthesia Checklist: Patient identified, Emergency Drugs available, Suction available and Patient being monitored Patient Re-evaluated:Patient Re-evaluated prior to induction Oxygen Delivery Method: Circle system utilized Preoxygenation: Pre-oxygenation with 100% oxygen Induction Type: IV induction Ventilation: Mask ventilation without difficulty LMA: LMA inserted LMA Size: 4.0 Number of attempts: 1 Airway Equipment and Method: Bite block Placement Confirmation: positive ETCO2 Tube secured with: Tape Dental Injury: Teeth and Oropharynx as per pre-operative assessment

## 2018-12-22 NOTE — Anesthesia Procedure Notes (Signed)
Anesthesia Regional Block: Popliteal block   Pre-Anesthetic Checklist: ,, timeout performed, Correct Patient, Correct Site, Correct Laterality, Correct Procedure, Correct Position, site marked, Risks and benefits discussed,  Surgical consent,  Pre-op evaluation,  At surgeon's request and post-op pain management  Laterality: Right  Prep: chloraprep       Needles:  Injection technique: Single-shot  Needle Type: Echogenic Stimulator Needle          Additional Needles:   Narrative:  Start time: 12/22/2018 12:26 PM End time: 12/22/2018 12:36 PM Injection made incrementally with aspirations every 5 mL.  Performed by: Personally  Anesthesiologist: Duane Boston, MD  Additional Notes: A functioning IV was confirmed and monitors were applied.  Sterile prep and drape, hand hygiene and sterile gloves were used.  Negative aspiration and test dose prior to incremental administration of local anesthetic. The patient tolerated the procedure well.Ultrasound  guidance: relevant anatomy identified, needle position confirmed, local anesthetic spread visualized around nerve(s), vascular puncture avoided.  Image printed for medical record.

## 2018-12-22 NOTE — Transfer of Care (Signed)
Immediate Anesthesia Transfer of Care Note  Patient: Mathew Acevedo  Procedure(s) Performed: OPEN REDUCTION INTERNAL FIXATION (ORIF) Right ankle trimalleolar fracture and syndesmosis; possible deltoid ligament repair (Right Ankle)  Patient Location: PACU  Anesthesia Type:GA combined with regional for post-op pain  Level of Consciousness: drowsy and patient cooperative  Airway & Oxygen Therapy: Patient Spontanous Breathing and Patient connected to face mask oxygen  Post-op Assessment: Report given to RN and Post -op Vital signs reviewed and stable  Post vital signs: Reviewed and stable  Last Vitals:  Vitals Value Taken Time  BP    Temp    Pulse 77 12/22/18 1420  Resp 15 12/22/18 1420  SpO2 100 % 12/22/18 1420  Vitals shown include unvalidated device data.  Last Pain:  Vitals:   12/22/18 1157  PainSc: 6          Complications: No apparent anesthesia complications

## 2018-12-23 ENCOUNTER — Encounter (HOSPITAL_BASED_OUTPATIENT_CLINIC_OR_DEPARTMENT_OTHER): Payer: Self-pay | Admitting: Orthopedic Surgery

## 2021-01-05 IMAGING — CT CT ANKLE*R* W/O CM
3 of 4 series · 15 of 33 positions shown, 17 images · non-contrast
Comparison: 12/17/2018

CLINICAL DATA: Fracture

EXAM:
CT OF THE RIGHT ANKLE WITHOUT CONTRAST
TECHNIQUE: Multidetector CT imaging of the right ankle was performed according
to the standard protocol. Multiplanar CT image reconstructions were
also generated.

[Series 4: axial st · axial · 0.30mm/px · z∈[-180,-22]mm · 7 of 95 slices shown, 9 images]
[im 8/95  soft-tissue]
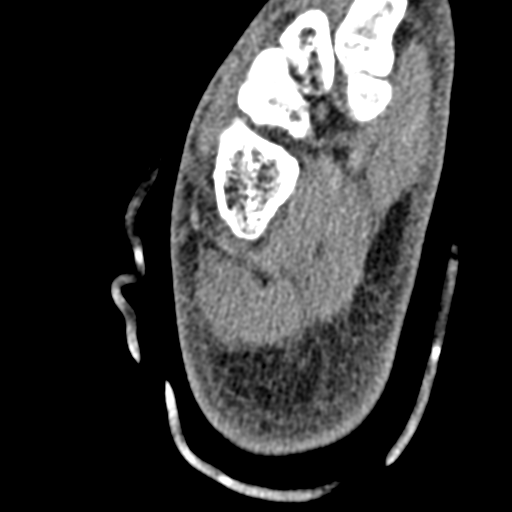
[im 8/95  bone]
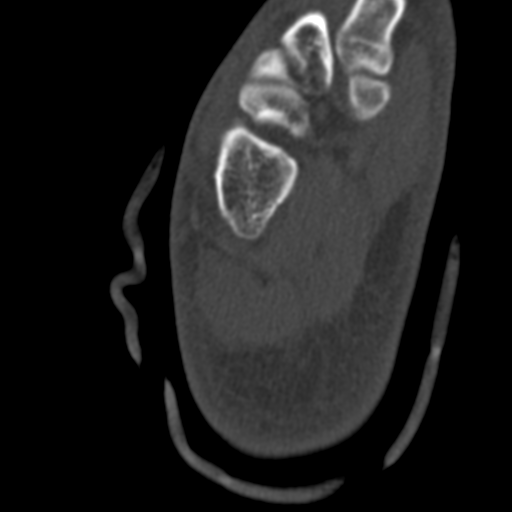
[im 22/95  bone]
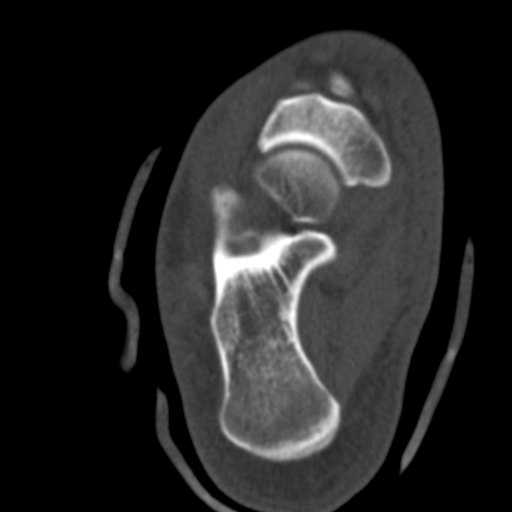
[im 37/95  bone]
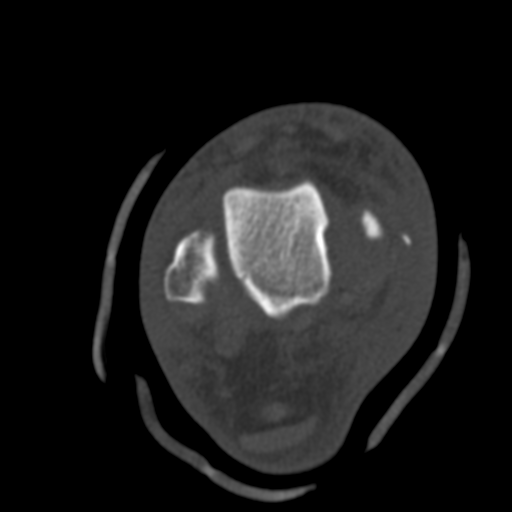
[im 51/95  bone]
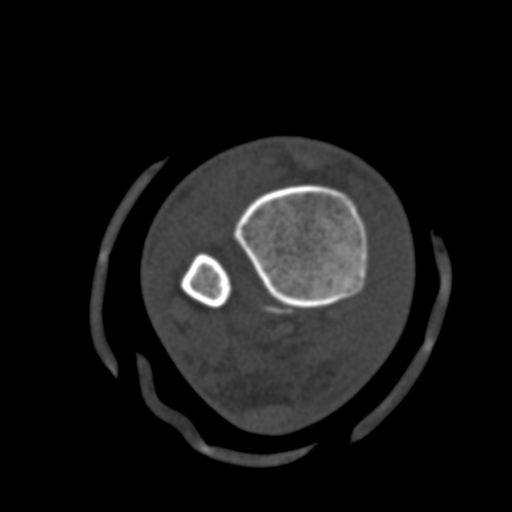
[im 58/95  soft-tissue]
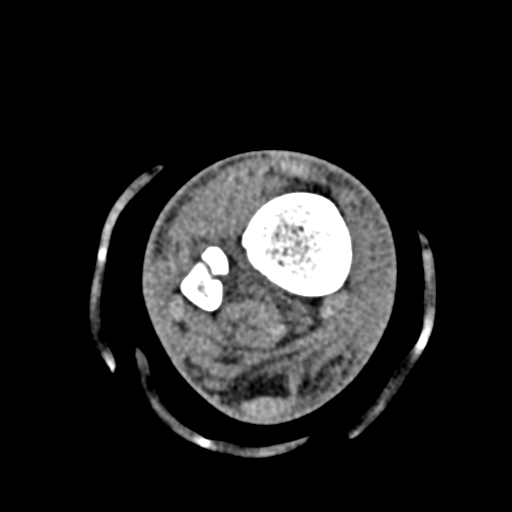
[im 58/95  bone]
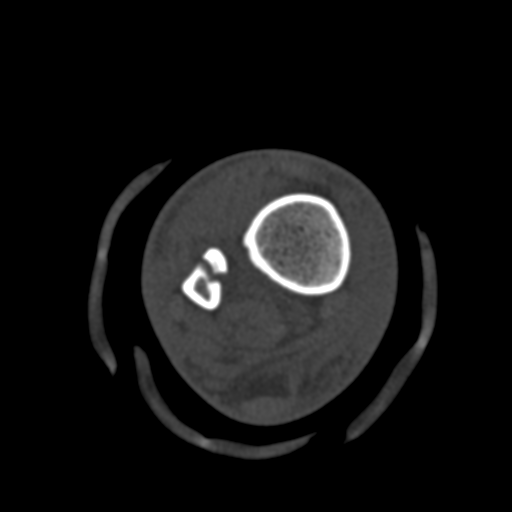
[im 73/95  bone]
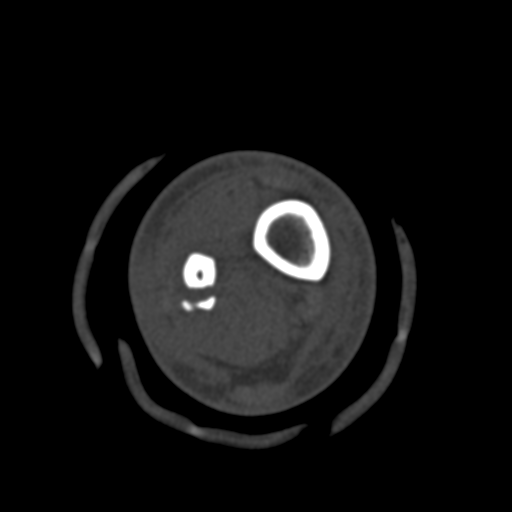
[im 87/95  bone]
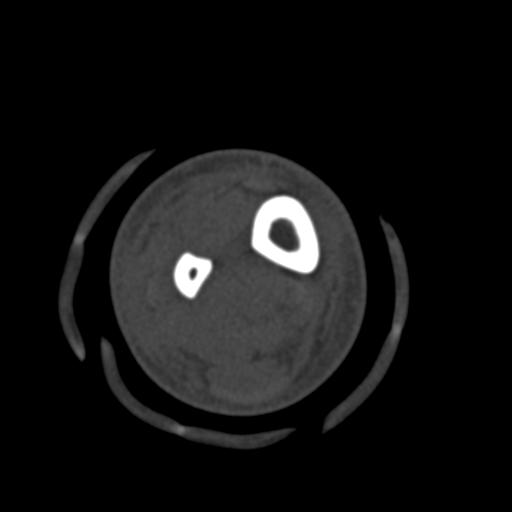

[Series 9: coronal st · coronal · 0.29mm/px · 3 of 84 slices shown]
[im 17/84  bone]
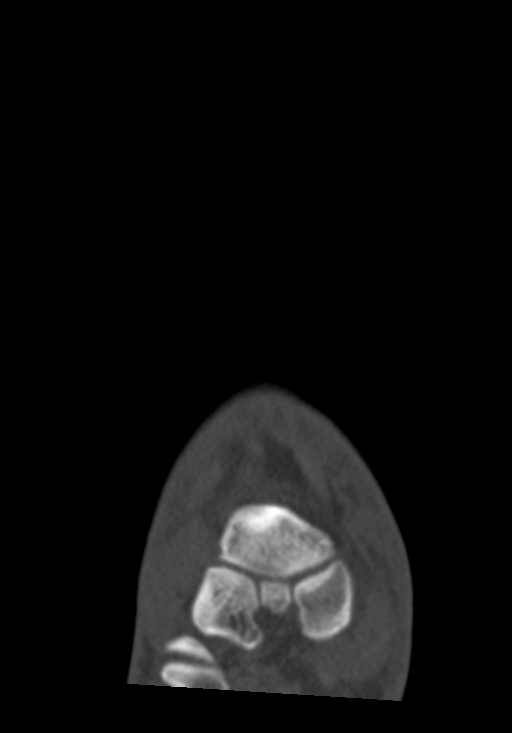
[im 34/84  bone]
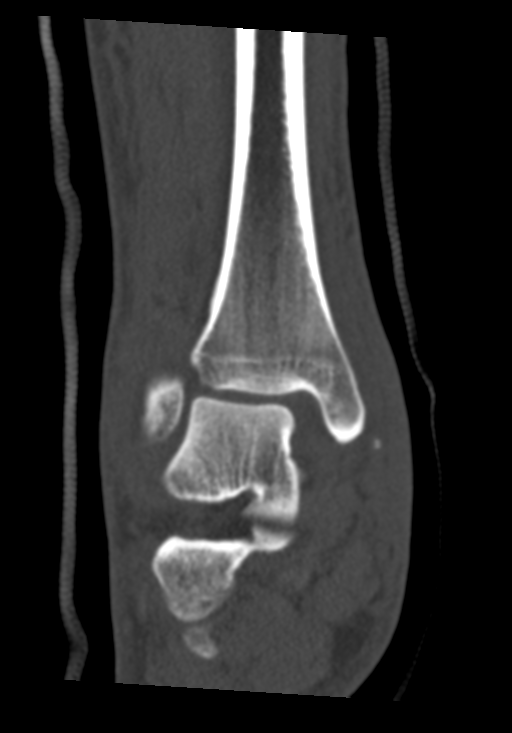
[im 50/84  bone]
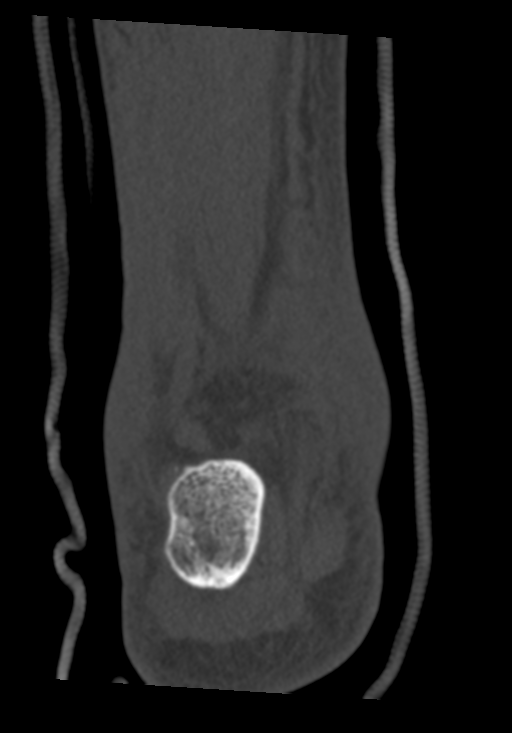

[Series 10: sagittal st · sagittal · 0.33mm/px · 5 of 72 slices shown]
[im 12/72  bone]
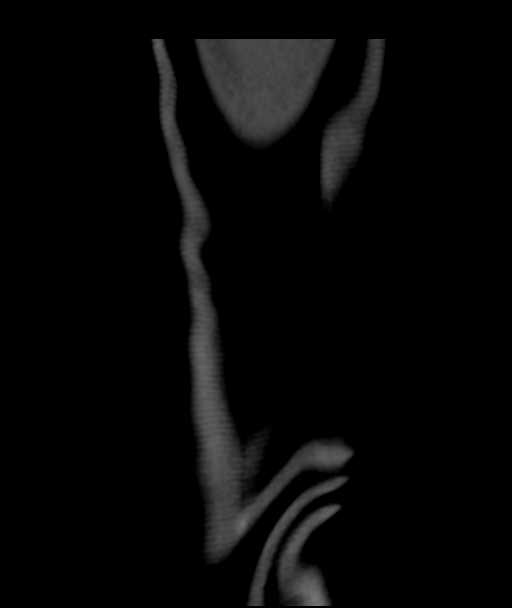
[im 24/72  bone]
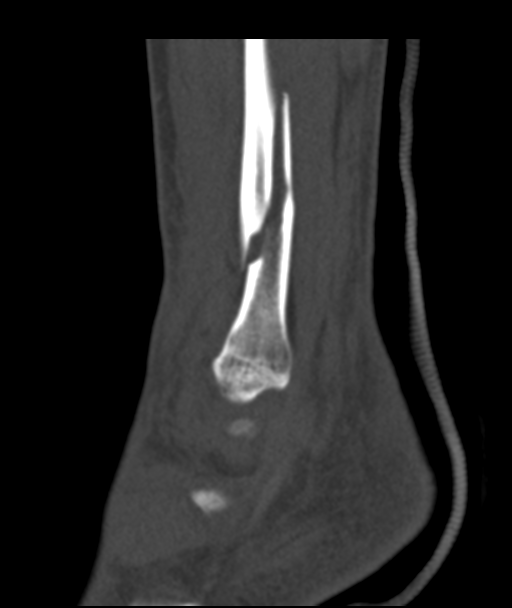
[im 36/72  bone]
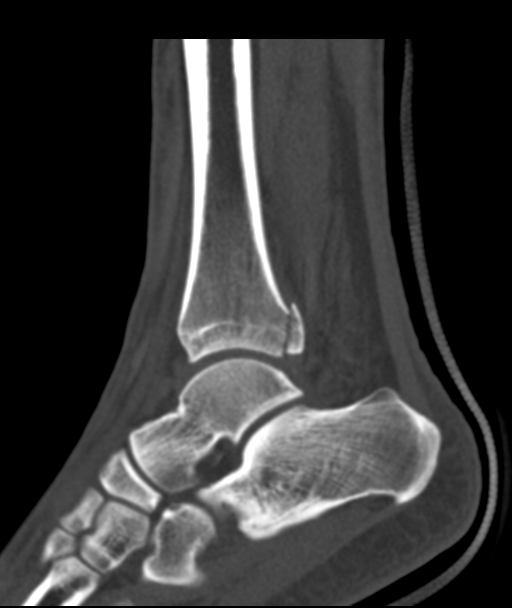
[im 48/72  bone]
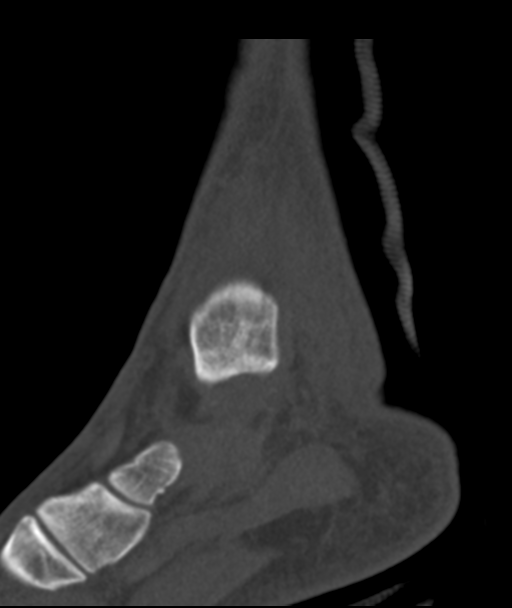
[im 60/72  bone]
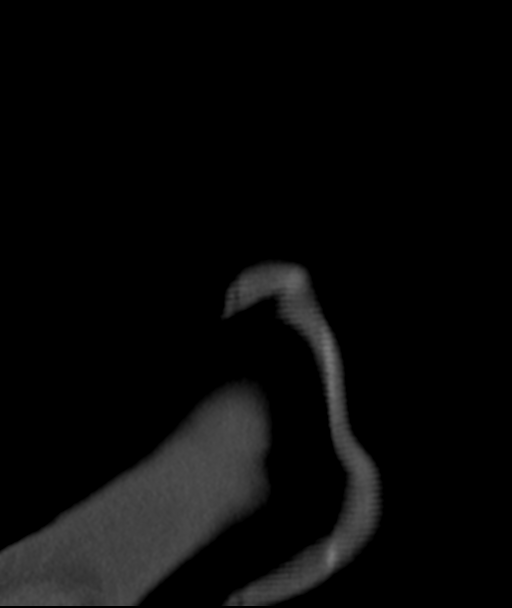

[15 of 33 positions shown; findings below may reference images not displayed]

FINDINGS: Bones/Joint/Cartilage

Again noted is a displaced fracture of the distal fibular diaphysis.
There is widening of the medial clear space. There is an acute
fracture of the posterior malleolus.

Ligaments

Suboptimally assessed by CT.

Muscles and Tendons

Not well evaluated on this exam.

Soft tissues

There is extensive surrounding soft tissue swelling.
IMPRESSION: 1. Displaced fracture of the distal fibular diaphysis.
2. Acute fracture of the posterior malleolus.
3. Persistent lateral talar shift.
4. Extensive surrounding soft tissue swelling.
